# Patient Record
Sex: Female | Born: 1940 | Race: White | Hispanic: No
Health system: Southern US, Community
[De-identification: ages and names within clinical notes are randomized; demographics above are authoritative.]

---

## 2012-01-24 ENCOUNTER — Inpatient Hospital Stay: Payer: Self-pay | Admitting: Specialist

## 2012-01-24 LAB — COMPREHENSIVE METABOLIC PANEL
Alkaline Phosphatase: 80 U/L (ref 50–136)
Calcium, Total: 9.1 mg/dL (ref 8.5–10.1)
Co2: 24 mmol/L (ref 21–32)
EGFR (Non-African Amer.): 41 — ABNORMAL LOW
Potassium: 4.1 mmol/L (ref 3.5–5.1)
SGPT (ALT): 28 U/L (ref 12–78)

## 2012-01-24 LAB — URINALYSIS, COMPLETE
Bilirubin,UR: NEGATIVE
Blood: NEGATIVE
Glucose,UR: NEGATIVE mg/dL (ref 0–75)
Hyaline Cast: 1
Ph: 5 (ref 4.5–8.0)
Specific Gravity: 1.011 (ref 1.003–1.030)
Squamous Epithelial: NONE SEEN
WBC UR: 1 /HPF (ref 0–5)

## 2012-01-24 LAB — CBC
HGB: 12.4 g/dL (ref 12.0–16.0)
MCH: 31.6 pg (ref 26.0–34.0)
MCV: 94 fL (ref 80–100)
Platelet: 178 10*3/uL (ref 150–440)
RDW: 13.5 % (ref 11.5–14.5)
WBC: 8.2 10*3/uL (ref 3.6–11.0)

## 2012-01-24 LAB — PROTIME-INR
INR: 0.9
Prothrombin Time: 12.8 secs (ref 11.5–14.7)

## 2012-01-25 LAB — CBC WITH DIFFERENTIAL/PLATELET
Basophil #: 0 10*3/uL (ref 0.0–0.1)
Basophil %: 0.3 %
Eosinophil %: 1.6 %
HCT: 30.3 % — ABNORMAL LOW (ref 35.0–47.0)
HGB: 10 g/dL — ABNORMAL LOW (ref 12.0–16.0)
Lymphocyte #: 1.2 10*3/uL (ref 1.0–3.6)
Lymphocyte %: 13.5 %
Monocyte %: 8 %
Neutrophil #: 6.9 10*3/uL — ABNORMAL HIGH (ref 1.4–6.5)
RDW: 14 % (ref 11.5–14.5)
WBC: 9 10*3/uL (ref 3.6–11.0)

## 2012-01-25 LAB — BASIC METABOLIC PANEL
Anion Gap: 10 (ref 7–16)
BUN: 16 mg/dL (ref 7–18)
Chloride: 101 mmol/L (ref 98–107)
Creatinine: 1.24 mg/dL (ref 0.60–1.30)
EGFR (Non-African Amer.): 44 — ABNORMAL LOW
Glucose: 137 mg/dL — ABNORMAL HIGH (ref 65–99)
Osmolality: 275 (ref 275–301)
Potassium: 4.1 mmol/L (ref 3.5–5.1)
Sodium: 136 mmol/L (ref 136–145)

## 2012-02-01 ENCOUNTER — Emergency Department: Payer: Self-pay | Admitting: Emergency Medicine

## 2012-02-01 LAB — URINALYSIS, COMPLETE
Bilirubin,UR: NEGATIVE
Glucose,UR: 50 mg/dL (ref 0–75)
Hyaline Cast: 1
Nitrite: NEGATIVE
Ph: 7 (ref 4.5–8.0)
Protein: NEGATIVE
Specific Gravity: 1.01 (ref 1.003–1.030)
WBC UR: <1 /HPF (ref 0–5)

## 2012-02-01 LAB — COMPREHENSIVE METABOLIC PANEL
BUN: 23 mg/dL — ABNORMAL HIGH (ref 7–18)
Calcium, Total: 8.6 mg/dL (ref 8.5–10.1)
Chloride: 102 mmol/L (ref 98–107)
Co2: 25 mmol/L (ref 21–32)
EGFR (African American): 41 — ABNORMAL LOW
Glucose: 199 mg/dL — ABNORMAL HIGH (ref 65–99)
SGOT(AST): 55 U/L — ABNORMAL HIGH (ref 15–37)
SGPT (ALT): 53 U/L (ref 12–78)
Total Protein: 6.8 g/dL (ref 6.4–8.2)

## 2012-02-01 LAB — CBC
HCT: 30 % — ABNORMAL LOW (ref 35.0–47.0)
MCV: 93 fL (ref 80–100)
Platelet: 361 10*3/uL (ref 150–440)
RBC: 3.22 10*6/uL — ABNORMAL LOW (ref 3.80–5.20)
WBC: 8.9 10*3/uL (ref 3.6–11.0)

## 2013-12-19 IMAGING — CR RIGHT HIP - COMPLETE 2+ VIEW
1 series · 2 of 2 positions shown · non-contrast
Comparison: none

REASON FOR EXAM: hip fracture
COMMENTS:

PROCEDURE:     DXR - DXR HIP RIGHT COMPLETE  - January 24, 2012  [DATE]
RESULT:
A comminuted impacted superiorly displaced subcapital femoral neck fracture
is identified on the right.

[Series 1: t hip ap right · 0.14mm/px · 2 of 2 slices shown]
[im 1/2]
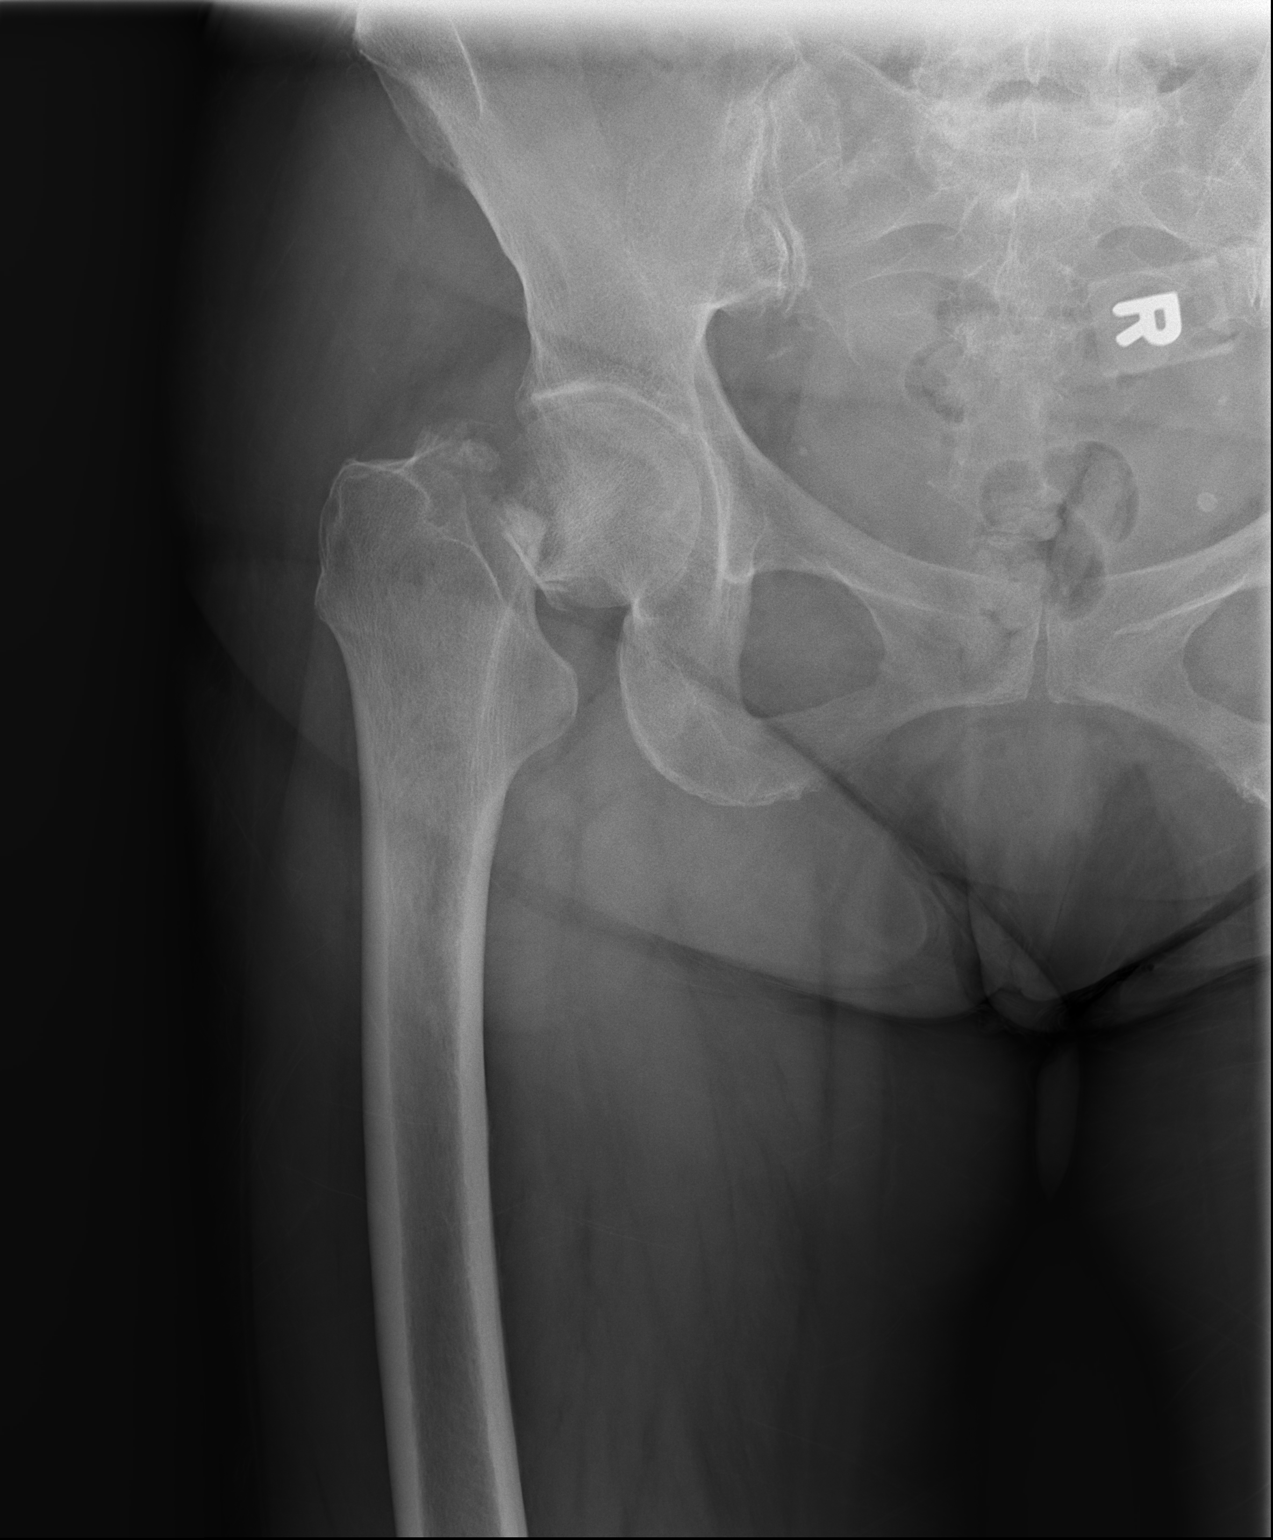
[im 2/2]
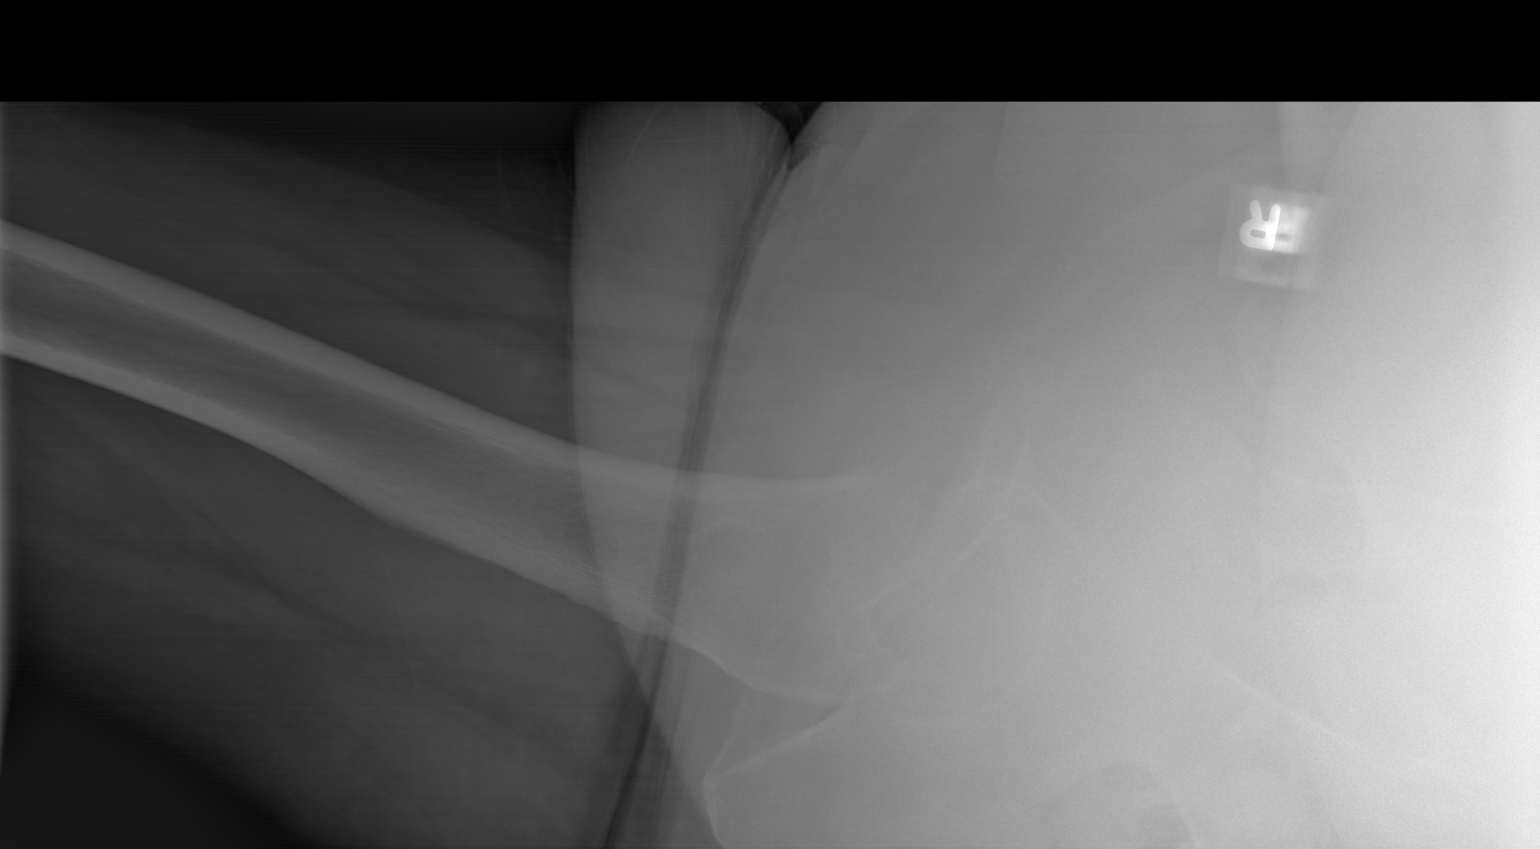

[2 of 2 positions shown; findings below may reference images not displayed]

IMPRESSION: Subcapital femoral neck fracture on the right.

## 2013-12-19 IMAGING — CR PELVIS - 1-2 VIEW
1 series · 1 of 1 positions shown · non-contrast
Comparison: none

REASON FOR EXAM: post op in PACU
COMMENTS:   Bedside (portable):Y

PROCEDURE:     DXR - DXR PELVIS AP ONLY  - January 24, 2012  [DATE]
RESULT:     AP pelvis reveals good anatomic alignment right hip replacement.

[ap]
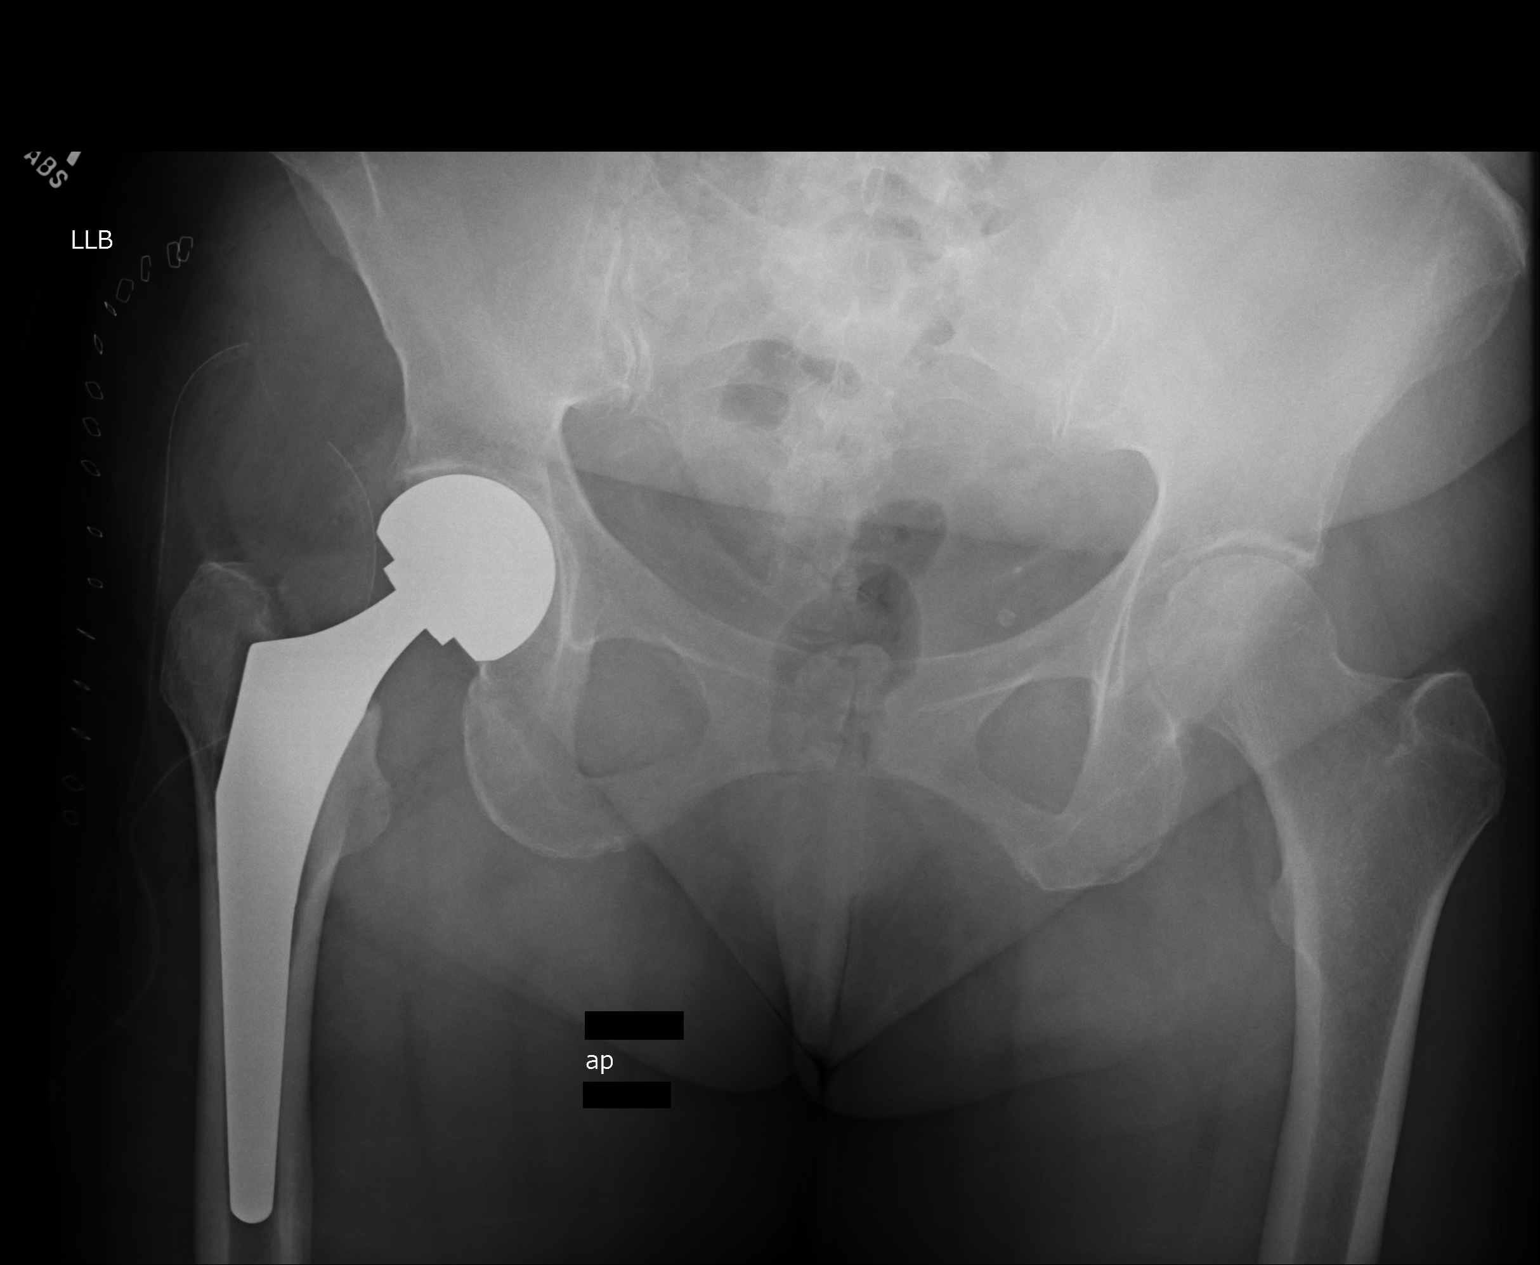

[1 of 1 positions shown; findings below may reference images not displayed]

IMPRESSION: Good anatomic alignment right hip replacement.

## 2013-12-27 IMAGING — CR DG LUMBAR SPINE 2-3V
1 series · 3 of 3 positions shown · non-contrast
Comparison: none

REASON FOR EXAM: Fall, bilateral hip Jim
COMMENTS:

[Series 1: ap · 0.17mm/px · 3 of 3 slices shown]
[im 1/3]
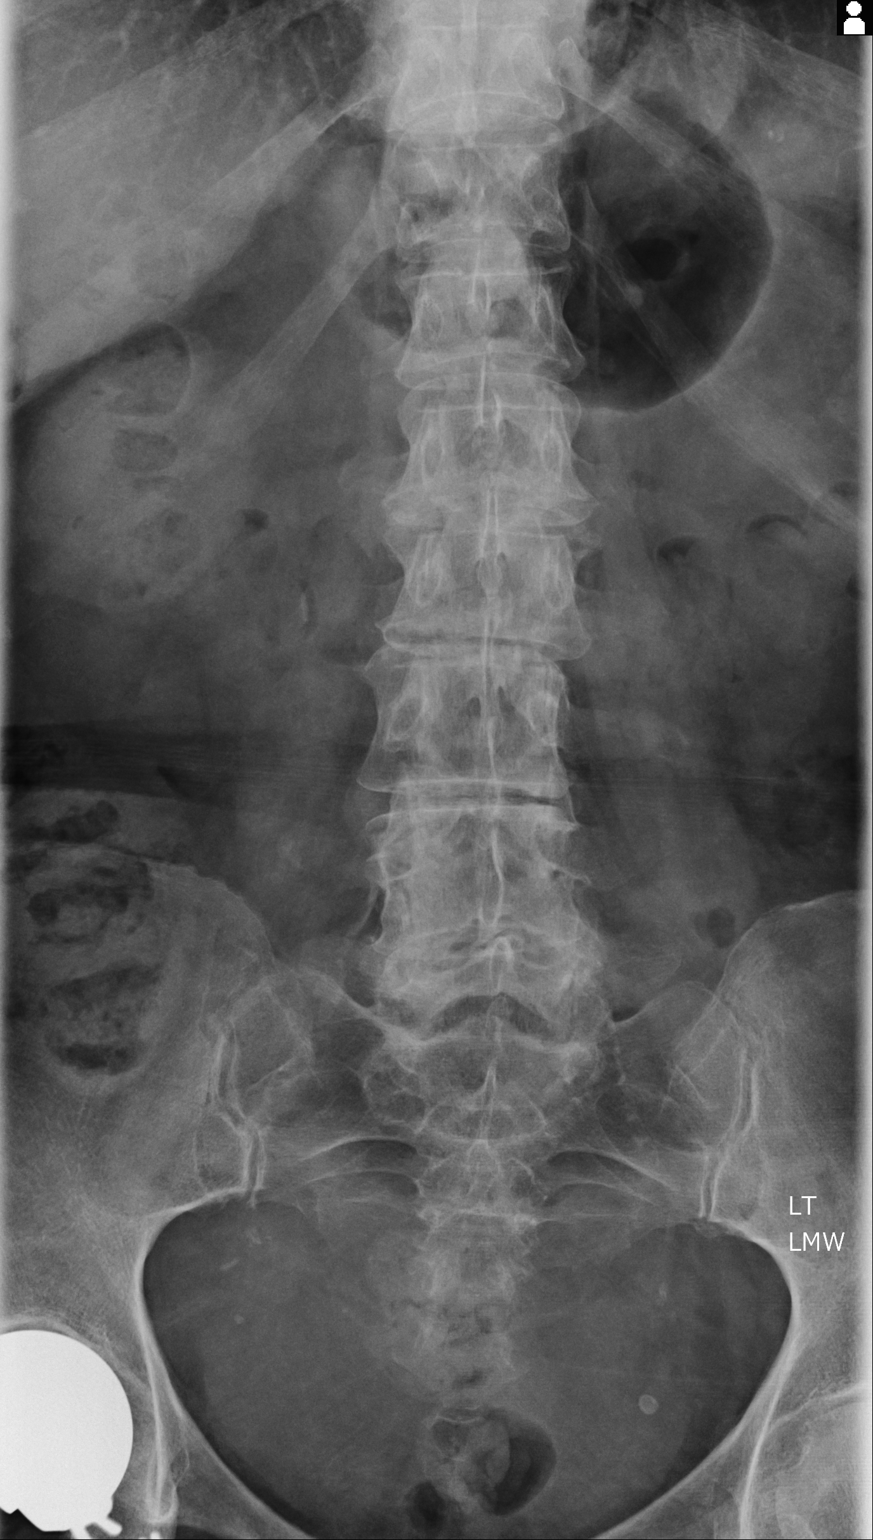
[im 2/3]
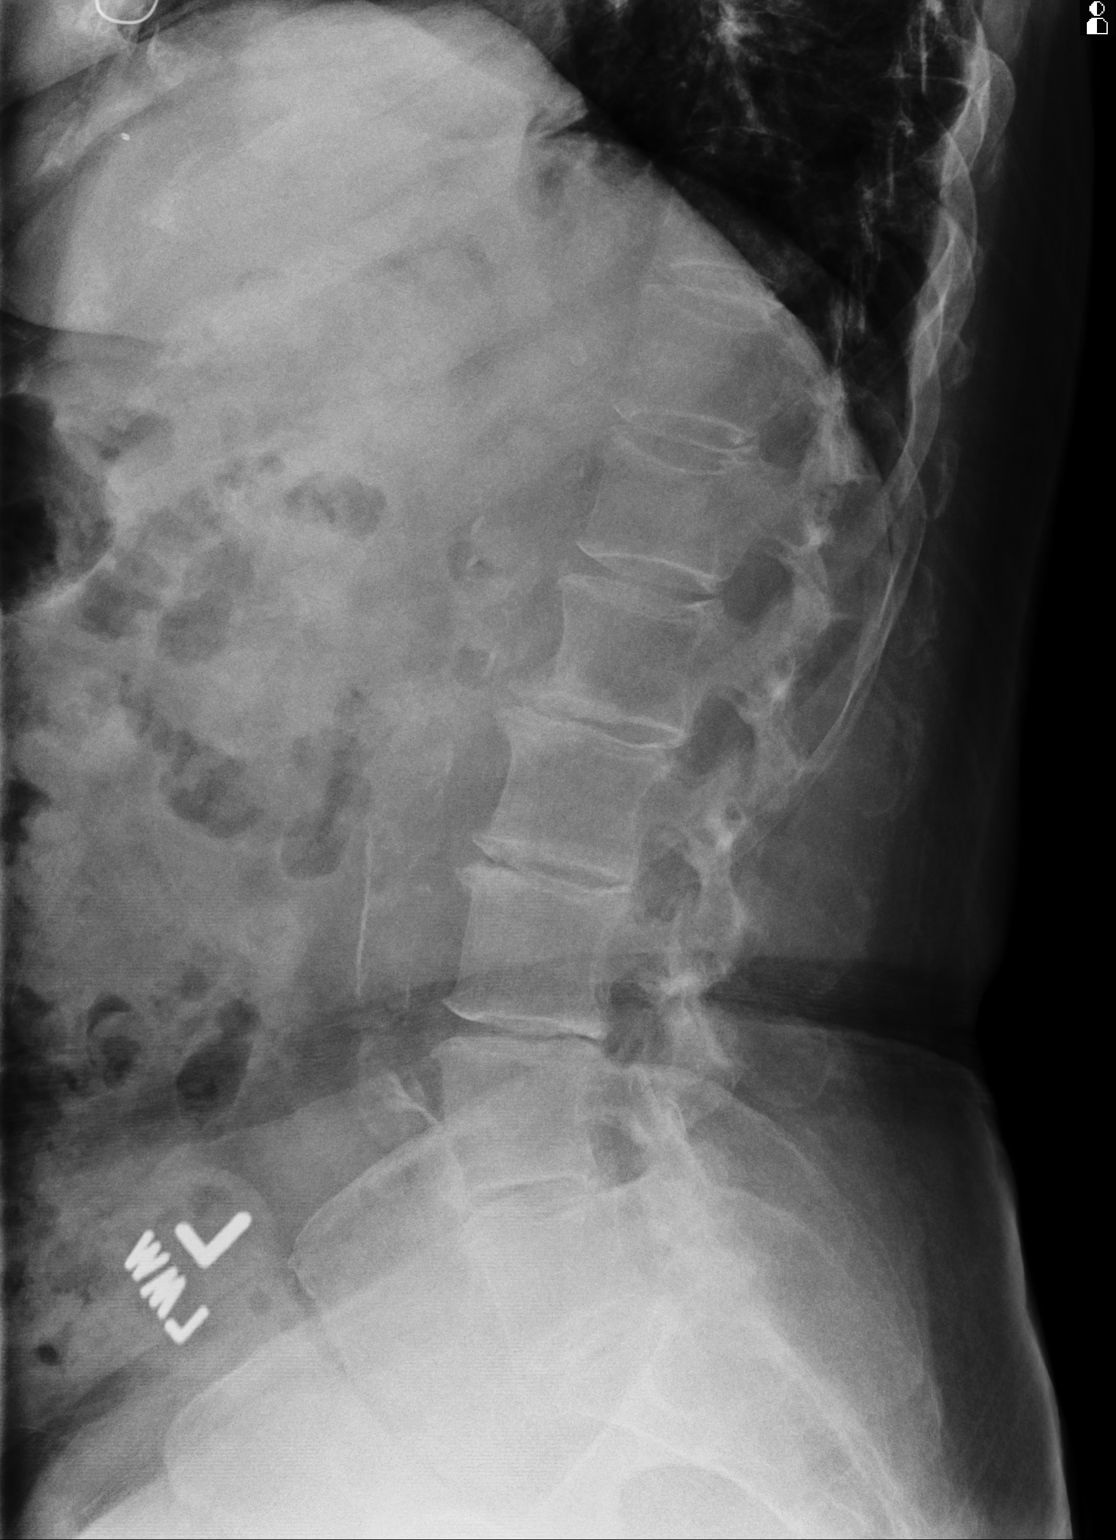
[im 3/3]
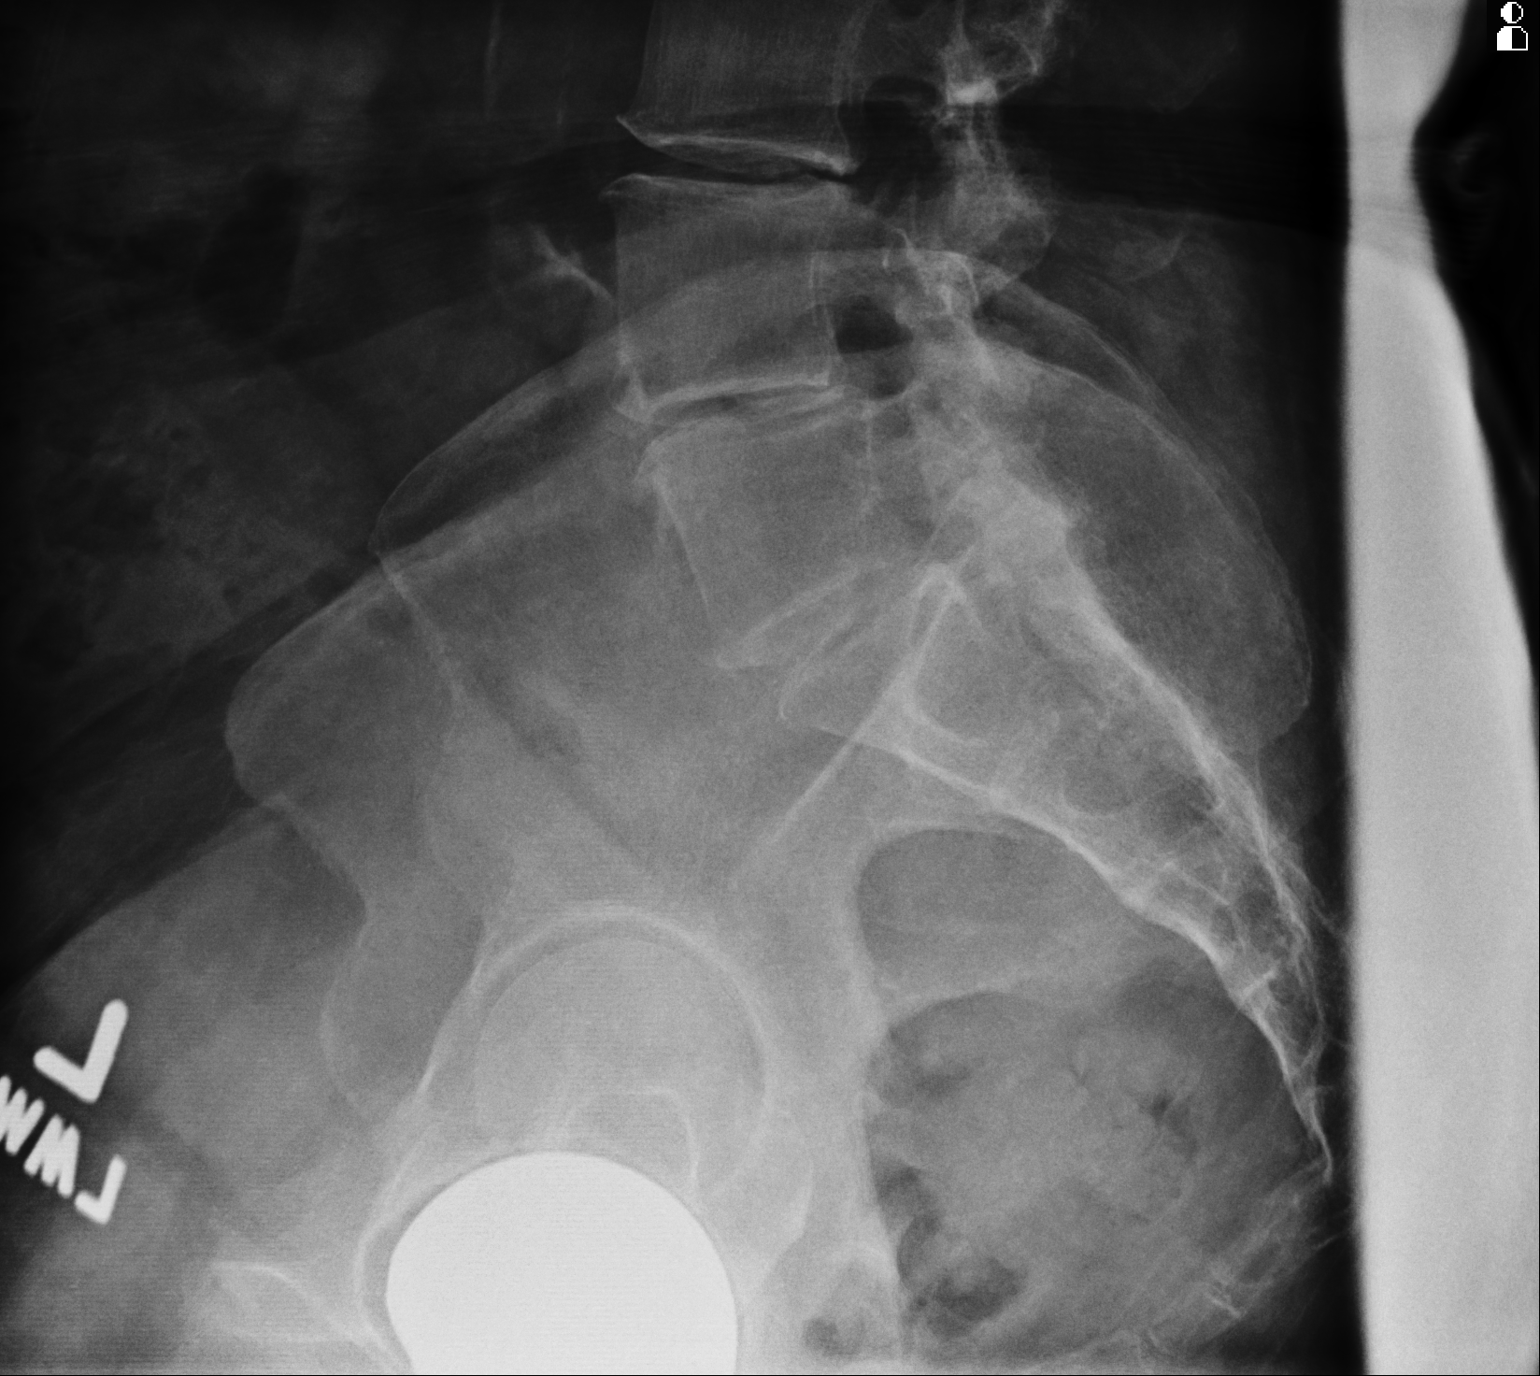

[3 of 3 positions shown; findings below may reference images not displayed]

PROCEDURE:     DXR - DXR LUMBAR SPINE AP AND LATERAL  - February 01, 2012  [DATE]

RESULT:     Degenerative disc narrowing is present at L4-L5, L5-S1 and L3-L4
as well as at T12-L1 and L1-L2 as well as L2-L3. No compression deformity is
present. Facet hypertrophy is seen. Atherosclerotic calcification is
present. Right hip prosthesis is present.
IMPRESSION: Chronic degenerative change. No acute bony abnormality
evident.

[REDACTED]

## 2014-07-23 NOTE — H&P (Signed)
Subjective/Chief Complaint Right hip pain    History of Present Illness 74 year old female missed a step this AM at cousin's house and injured the right hip.  Brought to Emergency Room where exam and X-rays show a displaced subcapital fracture right hip.  Patient and daughter advised of the findings and treatment options discussed.  They agree that surgery is indicated as she has had bilateral total knees in the past and are familiar with this.  Risks and benefits of surgery were discussed at length including but not limited to infection, non union, nerve or blood vessed damage, non union, need for repeat surgery, blood clots and lung emboli, and death.  She fell 2 days ago in [place], [place] and had head stitches.  CT was normal.  Has had several TIAs in last year and is on Plavix. Had MI in the past and a CABG procedure in 2006.  Cleared by the medical service for surgery which we will do today.   Past Med/Surgical Hx:  coronary artery disease:   hyperlipidemia:   cabge:   Polio:   HTN:   TIA - Transient Ischemic Attack:   bladder cancer with removal of the tumor status post chemoth:   cataract surgery:   lasix eye surgery:   Shoulder Surgery - Right:   Knee Surgery - Right:   Knee Surgery - Left:   ALLERGIES:  Clindamycin: Other  Codeine: Unknown  HOME MEDICATIONS: Medication Instructions Status  Plavix 75 mg oral tablet 1 tab(s) orally once a day Active  multivitamin 1 tab(s) orally once a day Active  niacin 1000 mg oral tablet, extended release 1 tab(s) orally once a day (at bedtime) Active  Crestor 40 mg oral tablet 1 tab(s) orally once a day (at bedtime) Active  metoprolol tartrate 25 mg oral tablet 1 tab(s) orally 2 times a day Active  candesartan 16 milligram(s) orally 2 times a day Active  paroxetine 20 mg oral tablet 1 tab(s) orally once a day Active  pramipexole 0.5 mg oral tablet 1 tab(s) orally once a day (at bedtime) Active   Family and Social History:   Family  History Non-Contributory    Social History negative tobacco   Review of Systems:   Fever/Chills No    Cough No    Sputum No    Abdominal Pain No   Physical Exam:   GEN well developed, well nourished, no acute distress    HEENT pink conjunctivae    NECK supple    RESP normal resp effort    CARD regular rate    ABD denies tenderness    GU foley catheter in place    LYMPH negative neck    EXTR negative edema, right leg short and rotated.  circulation/sensation/motor function good and skin intact.  pain with range of motion of hip    SKIN normal to palpation    NEURO motor/sensory function intact    PSYCH alert, A+O to time, place, person, good insight   Lab Results: Hepatic:  21-Oct-13 07:58    Bilirubin, Total 0.5   Alkaline Phosphatase 80   SGPT (ALT) 28   SGOT (AST) 25   Total Protein, Serum 7.0   Albumin, Serum 3.8  Routine BB:  21-Oct-13 08:22    Crossmatch Unit 1 Ready   Crossmatch Unit 2 Ready (Result(s) reported on 24 Jan 2012 at 10:59AM.)   ABO Group + Rh Type O Positive   Antibody Screen NEGATIVE (Result(s) reported on 24 Jan 2012  at 09:36AM.)  Cardiology:  21-Oct-13 08:31    Ventricular Rate 59   Atrial Rate 59   P-R Interval 150   QRS Duration 86   QT 436   QTc 431   P Axis 28   R Axis 22   T Axis 88   ECG interpretation Sinus bradycardia Nonspecific ST and T wave abnormality Abnormal ECG No previous ECGs available ----------unconfirmed---------- Confirmed by OVERREAD, NOT ([id number]), editor PEARSON, Tanysha ([id number]) on 01/24/2012 9:36:30 AM  Routine Chem:  21-Oct-13 07:58    Glucose, Serum  159   BUN  23   Creatinine (comp)  1.31   Sodium, Serum 139   Potassium, Serum 4.1   Chloride, Serum 104   CO2, Serum 24   Calcium (Total), Serum 9.1   Osmolality (calc) 285   eGFR (African American)  48   eGFR (Non-African American)  41 (eGFR values <3m/min/1.73 m2 may be an indication of chronic kidney disease (CKD). Calculated eGFR is  useful in patients with stable renal function. The eGFR calculation will not be reliable in acutely ill patients when serum creatinine is changing rapidly. It is not useful in  patients on dialysis. The eGFR calculation may not be applicable to patients at the low and high extremes of body sizes, pregnant women, and vegetarians.)   Anion Gap 11  Routine UA:  21-Oct-13 09:09    Color (UA) Straw   Clarity (UA) Clear   Glucose (UA) Negative   Bilirubin (UA) Negative   Ketones (UA) Negative   Specific Gravity (UA) 1.011   Blood (UA) Negative   pH (UA) 5.0   Protein (UA) Negative   Nitrite (UA) Negative   Leukocyte Esterase (UA) Negative (Result(s) reported on 24 Jan 2012 at 09:41AM.)   RBC (UA) <1 /HPF   WBC (UA) 1 /HPF   Bacteria (UA) TRACE   Epithelial Cells (UA) NONE SEEN   Mucous (UA) PRESENT   Hyaline Cast (UA) 1 /LPF (Result(s) reported on 24 Jan 2012 at 09:41AM.)  Routine Coag:  21-Oct-13 10:51    Prothrombin 12.8   INR 0.9 (INR reference interval applies to patients on anticoagulant therapy. A single INR therapeutic range for coumarins is not optimal for all indications; however, the suggested range for most indications is 2.0 - 3.0. Exceptions to the INR Reference Range may include: Prosthetic heart valves, acute myocardial infarction, prevention of myocardial infarction, and combinations of aspirin and anticoagulant. The need for a higher or lower target INR must be assessed individually. Reference: The Pharmacology and Management of the Vitamin K  antagonists: the seventh ACCP Conference on Antithrombotic and Thrombolytic Therapy. CUTMLY.6503Sept:126 (3suppl): [id number] A HCT value >55% may artifactually increase the PT.  In one study,  the increase was an average of 25%. Reference:  "Effect on Routine and Special Coagulation Testing Values of Citrate Anticoagulant Adjustment in Patients with High HCT Values." American Journal of Clinical Pathology  2006;126:400-405.)  Routine Hem:  21-Oct-13 07:58    WBC (CBC) 8.2   RBC (CBC) 3.93   Hemoglobin (CBC) 12.4   Hematocrit (CBC) 37.0   Platelet Count (CBC) 178 (Result(s) reported on 24 Jan 2012 at 08:21AM.)   MCV 94   MCH 31.6   MCHC 33.5   RDW 13.5     Assessment/Admission Diagnosis Displaced right subcapital hip fracture    Plan Right hip hemiarthoplasty   Electronic Signatures: MPark Breed(MD)  (Signed 21-Oct-13 13:35)  Authored: CHIEF COMPLAINT and HISTORY, PAST MEDICAL/SURGIAL HISTORY, ALLERGIES, HOME  MEDICATIONS, FAMILY AND SOCIAL HISTORY, REVIEW OF SYSTEMS, PHYSICAL EXAM, LABS, ASSESSMENT AND PLAN   Last Updated: 21-Oct-13 13:35 by Park Breed (MD)

## 2014-07-23 NOTE — Consult Note (Signed)
PATIENT NAME:  Karla Lynch, Karla Lynch MR#:  [id number] DATE OF BIRTH:  [birth year]  DATE OF CONSULTATION:  01/24/2012  REFERRING PHYSICIAN:  Dr. Deeann Saint  CONSULTING PHYSICIAN:  Brittlyn Cloe A. Allena Katz, MD  PRIMARY CARE PHYSICIAN: Primary care physician is in [place], [place]  CARDIOLOGIST: Patient's cardiologist is in [place], [place]. I do not have the names.   REASON FOR CONSULTATION: Preop medical clearance. Patient had a fall earlier today, sustained a subcapital femoral neck fracture on the right hip.   HISTORY OF PRESENT ILLNESS: Ms. Gootee is a pleasant 74 year old Caucasian female who has history of hypertension, hyperlipidemia, coronary artery disease status post CABG x1 in 2006, thereafter had a couple of stress tests which were essentially negative comes to the Emergency Room, brought in by daughter today after she had sustained a fall at a family member's home here in [place] when she missed a step this morning landing on her face and then not able to walk and found in the Emergency Room to have right subcapital femoral neck fracture. Internal medicine was consulted for preop medical clearance. Patient is being admitted by Dr. Deeann Saint.   Patient was visiting in [place], [place] past weekend and on Saturday she went to see the museums, thereafter was heading home and missed a step at the escalator, fell and had a laceration on her scalp in the posterior scalp. She went to [facility] Emergency Room, got staples placed, was discharged to home after her CT was essentially negative for any bleed or any fracture. She was doing well and had sustained a fall this morning resulting in right femoral neck fracture. Per daughter, patient has been very functional. Denies any chest pain, shortness of breath. Patient says she is able to climb flight of stairs without any difficulty. She has had stress test in the past which were essentially negative.   ALLERGIES: Codeine causes vomiting  and hallucinations. Clindamycin causes vomiting.   HOME MEDICATIONS:  1. Candesartan 1 tab daily, do not know the dose.  2. Crestor 40 mg at bedtime.  3. Metoprolol tartrate 25 mg b.i.d.  4. Multivitamin p.o. daily.  5. Niacin 1000 mg extended-release at bedtime.  6. Plavix 75 mg daily.   PAST MEDICAL HISTORY:  1. Hypertension.  2. Hyperlipidemia.  3. Coronary artery disease, status post CABG x1 in 2006. Patient had stress test after the coronary artery bypass graft and they are essentially negative.  4. Bilateral total knee replacement.  5. Right shoulder surgery due to rotator cuff tear.  6. 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  7. Lasix eye surgery for corrected vision.  8. Cataract surgery.  9. Bladder cancer with removal of the tumor status post chemotherapy.   FAMILY HISTORY: Sister had major cerebrovascular accident and breast cancer. Father had cirrhosis of liver. Mother has history of Parkinson's disease.   SOCIAL HISTORY: Patient lives with daughter and her husband. Nonsmoker. Nonalcoholic.   REVIEW OF SYSTEMS: Patient is very groggy from the morphine. The only review of systems I could obtain was she was having left hip pain. She denied any shortness of breath or any chest pain. Unfortunately, patient's review of systems is limited because of her being so lethargic from the morphine.   PHYSICAL EXAMINATION:  GENERAL: Patient is lethargic from the morphine. She awakens on verbal commands, answers a few questions and drifts back to sleeping state.   VITAL SIGNS: She is afebrile, pulse 68 regular, blood pressure 114/53, sats 95% on 2 liters.  HEENT: Patient had laceration on the posterior scalp, staples present, no bleeding noted. Pupils are equal, round, and reactive to light and accommodation. Extraocular movements intact. Oral mucosa is moist.   NECK: Supple. No JVD. No carotid bruit.   RESPIRATORY: Clear to auscultation bilaterally. No rales, rhonchi, respiratory distress, or  labored breathing.   CARDIOVASCULAR: Both the heart sounds are normal. Rate, rhythm is regular. PMI not lateralized. Chest nontender.   EXTREMITIES: Good pedal pulses, good femoral pulses. Trace lower extremity edema. Patient's right lower extremity appears rotated externally.   ABDOMEN: Soft, benign, nontender. No organomegaly.   NEUROLOGIC: Unable to assess much, however, grossly appears intact. Patient is quite lethargic from the IV pain medicine. She is able to move her extremities without difficulty except the right lower extremity.   LABORATORY, DIAGNOSTIC AND RADIOLOGICAL DATA: EKG shows sinus rhythm with sinus bradycardia with nonspecific ST-T abnormality. No acute EKG ST elevation and depression. CBC within normal limits. Comprehensive metabolic panel within normal limits except creatinine of 1.31 and glucose of 159. Chest x-ray: No acute cardiopulmonary abnormality.   Right hip shows subcapital femoral neck fracture on the right. PT-INR within normal limits. Urinalysis negative for urinary tract infection.   ASSESSMENT: 74 year old Ms. Beckford with history of hypertension, coronary artery disease status post CABG, is being admitted for right femoral neck fracture status post fall today. Internal medicine was consulted for:  1. Preop medical clearance. Patient is okay for surgery. She is at a low to intermediate risk for surgery. Patient is otherwise functional. Is able to climb a flight of stairs without difficulty. No chest pain and does not have any shortness of breath. Her EKG does not show any acute changes. Will continue home medications, mainly her metoprolol and antihypertensives after surgery. Risks and complications were discussed with patient's daughter.  2. Hypertension. Patient is already is on candesartan and metoprolol.  3. Hyperlipidemia. On Niacin and Crestor which will be resumed postoperative.  4. Deep vein thrombosis prophylaxis per Dr. Hyacinth MeekerMiller.  5. Coronary artery  disease status post coronary artery bypass graft. Patient is on Plavix, metoprolol which we will continue after surgery. Her blood pressure appears stable at this time and her heart rate is stable as well.  6. Further work-up according to patient's clinical course. This was discussed with patient's daughter in the Emergency Room. Above also was discussed with Dr. Deeann SaintHoward Miller.    TIME SPENT: 55 minutes.   ____________________________ Wylie HailSona A. Allena KatzPatel, MD sap:cms D: 01/24/2012 11:58:49 ET T: 01/24/2012 12:13:36 ET JOB#: [id number]  cc: Trinidi Toppins A. Allena KatzPatel, MD, <Dictator> Willow OraSONA A Muhammad Vacca MD ELECTRONICALLY SIGNED 01/26/2012 11:25

## 2014-07-23 NOTE — Discharge Summary (Signed)
PATIENT NAME:  Karla Lynch, Karla Lynch MR#:  [id number] DATE OF BIRTH:  04-05-1941  DATE OF ADMISSION:  01/24/2012 DATE OF DISCHARGE:  01/27/2012  FINAL DIAGNOSES:  1. Displaced subcapital fracture right hip. 2. Multiple transient ischemic attacks. 3. Hypertension.  4. Hyperlipidemia.  5. Coronary artery disease with CABG procedure in 2006. Normal stress test after surgery. 6. Bilateral total knee replacements in the past.  7. Generalized arthritis. 8. Polio in left arm.   OPERATION: 01/24/2012 right hip hemiarthroplasty with an Accolade unipolar prosthesis.   COMPLICATIONS: None.   CONSULTATION: [facility].   DISCHARGE MEDICATIONS/HOME MEDICATIONS:  1. Atacand 16 mg b.i.d.  2. Lopressor 25 mg b.i.d.  3. Niaspan CR 1000 mg at bedtime.  4. Paxil 20 mg daily.  5. Mirapex 0.5 mg daily.  6. Crestor 40 mg at bedtime.  7. Refresh classic ophthalmic drops 1 drop both eyes p.r.n. for dry eyes. 8. Tramadol 50 mg q.6h. for pain.  9. Plavix 75 mg daily.  10. Iron 325 mg daily.   HISTORY: Patient is a 74 year old female who was traveling with her daughter back from a wedding in [place]. She had fallen Saturday in [place] and had a scalp laceration and head CT at [facility]. They stopped in [place] to stay with a cousin. On the morning of admission the patient missed a step on her niece's deck and landed on her right hip. She was brought to the Emergency Room where exam and x-rays revealed a displaced subcapital fracture right hip. Findings were relayed to the patient and her daughter and the recommendation for operative fixation was related as well. Patient has had multiple surgeries in the past and they agreed to surgical fixation of right hip. Risks and benefits were discussed with them at length and the need for her to stay here in [facility] for some rehab afterwards as well. The patient was cleared by the medical service for surgery. She did have a history  of multiple transient ischemic attacks. Her Plavix had not been taken since the previous day.   PAST MEDICAL HISTORY/ILLNESSES: As above.   MEDICATIONS: As above including Plavix.   ALLERGIES: Clindamycin, codeine products.    PAST SURGICAL HISTORY:  1. Bilateral total knees. 2. Right shoulder rotator cuff.  3. Lasix surgery for the eyes.  4. Cataract surgery. 5. Bladder cancer removal, status post chemotherapy.   FAMILY HISTORY: Sister had major CVA and breast cancer. Father had cirrhosis of liver. Mother had Parkinson's.   SOCIAL HISTORY: Patient lives with her daughter and husband. She does not smoke or drink.   REVIEW OF SYSTEMS: Otherwise unremarkable.   PHYSICAL EXAMINATION: The patient was alert and cooperative. The right leg was shortened, rotated and was very painful with rotation of the hip. Neurovascular status was good distally. Skin was intact. No other orthopedic injuries were noted. There were three staples in the posterior scalp.   LABORATORY, DIAGNOSTIC AND RADIOLOGICAL DATA: Laboratory data on admission was satisfactory.   HOSPITAL COURSE: Patient had not eaten that day so she was taken to surgery the afternoon of 01/24/2012 and underwent a right hip hemiarthroplasty with a Stryker Accolade prosthesis unipolar head. Postoperatively she did very well. No significant pain or bleeding. Her hemoglobin dropped to 10.0 and remained stable on postop day two. She was gradually ambulated and made slow progress with physical therapy. She was stable and ready for skilled nursing discharge on 01/27/2012. She had difficulty with urine production after Foley removal and Foley was  reinserted and will be clamped every three hours. They will continue this at skilled nursing and remove the Foley in two days. She will continue PT, weight bearing as tolerated on the right. She will be seen in my office in two weeks for exam and x-rays.   ____________________________ Valinda HoarHoward E. Cally Nygard,  MD hem:cms D: 01/27/2012 12:59:01 ET T: 01/27/2012 13:13:46 ET JOB#: [id number]  cc: Valinda HoarHoward E. Verlan Grotz, MD, <Dictator> Valinda HoarHOWARD E Dakoda Bassette MD ELECTRONICALLY SIGNED 01/28/2012 13:55

## 2014-07-23 NOTE — Consult Note (Signed)
Brief Consult Note: Diagnosis: HTN, Cad s/p CABG in 2006 x1, HL, right femoral neck fracture s/p fall today.   Patient was seen by consultant.   Consult note dictated.   Recommend to proceed with surgery or procedure.   Orders entered.   Discussed with Attending MD.   Comments: 1. OK to proceed with surgery. pt is at low to intermediate risk for surgery -risks and complications d/w pt's dter 2.resume home meds iincluding plavix after surgery -pt on metoprolol athome 3. DVT proph per dr Hyacinth Meekermiller thanks for the consult will follow with you d/w dr Hyacinth Meekermiller.  Electronic Signatures: Willow OraPatel, Vann Okerlund A (MD)  (Signed 21-Oct-13 11:47)  Authored: Brief Consult Note   Last Updated: 21-Oct-13 11:47 by Willow OraPatel, Micael Barb A (MD)

## 2014-07-23 NOTE — Op Note (Signed)
PATIENT NAME:  Karla Lynch, Karla J MR#:  [id number] DATE OF BIRTH:  [birth year]  DATE OF PROCEDURE:  01/24/2012  PREOPERATIVE DIAGNOSIS: Displaced subcapital fracture, right hip.   POSTOPERATIVE DIAGNOSIS: Displaced subcapital fracture, right hip.   OPERATION: Right hip hemiarthroplasty with a Stryker Accolade cementless prosthesis (#4 femoral stem, 47 mm unipolar head, +8 mm neck length).   SURGEON: Valinda HoarHoward E. Esten Dollar, MD    ANESTHESIA: General endotracheal.   COMPLICATIONS: None.   DRAINS: Two Autovacs.   ESTIMATED BLOOD LOSS: 400 mL.  REPLACEMENTS: None.   OPERATIVE PROCEDURE: The patient was brought to the operating room where she underwent satisfactory general endotracheal anesthesia in the supine position as she was on Plavix and could not have a spinal. Right hip was prepped and draped in a sterile fashion and a posterolateral incision made. Dissection was carried out sharply through subcutaneous tissue and fascia with a Charnley retractor being inserted. Electrocautery was used for hemostasis. Bleeding was not significant. The short external rotators were divided and tagged and the posterior capsule was opened in a T-fashion. The femoral neck was cut with an oscillating saw to provide better exposure and the femoral head was then removed. This measured between 47 mm in size. A trial 47 and 48 mm head were put into the acetabulum and the 47 mm head fit the best. The remaining prominence of the ligamentum flavum was excised. The femoral canal was opened with a Charnley canal finder and the curette used to remove cancellous bone. Sequential rasping was done with the Accolade femoral rasp up to a size 4 which fit very snugly. Calcar reamer was used to smooth off the neck. Trial reductions were then carried out with the 47 mm head using standard +4 and +8 neck lengths. The +8 neck length fit the best with the best leg length and the best tension. There was no pistoning. The trials were removed and  the canal irrigated again. The #4 Accolade femoral stem was inserted securely. The 47 mm unipolar head with an 8 mm neck length was attached and the hip was reduced. Leg lengths were again excellent. The hip was stable. After irrigation the posterior capsule was closed with #2 Ti-Cron sutures. The short external rotators were repaired with the same suture. The deep fascia was closed with 0 Vicryl over an Autovac drain and the subcutaneous tissue was closed with 2-0 Vicryl over another Autovac. The skin was closed with staples. Autovac was activated and a dry sterile dressing applied. Drapes were removed and the patient was transferred to her hospital bed. Leg lengths were excellent. The hip was stable. She was awakened and taken to recovery in good condition.   ____________________________ Valinda HoarHoward E. Carmello Cabiness, MD hem:drc D: 01/24/2012 16:38:49 ET T: 01/24/2012 17:19:40 ET JOB#: [id number]  cc: Valinda HoarHoward E. Nelton Amsden, MD, <Dictator> Valinda HoarHOWARD E Shandon Matson MD ELECTRONICALLY SIGNED 01/24/2012 19:04
